# Patient Record
Sex: Female | Born: 1995 | Race: Black or African American | Hispanic: No | Marital: Married | Smoking: Never smoker
Health system: Southern US, Community
[De-identification: ages and names within clinical notes are randomized; demographics above are authoritative.]

## PROBLEM LIST (undated history)

## (undated) DIAGNOSIS — K5090 Crohn's disease, unspecified, without complications: Secondary | ICD-10-CM

## (undated) DIAGNOSIS — F419 Anxiety disorder, unspecified: Secondary | ICD-10-CM

## (undated) HISTORY — DX: Crohn's disease, unspecified, without complications: K50.90

## (undated) HISTORY — PX: COLON SURGERY: SHX602

## (undated) HISTORY — PX: CHOLECYSTECTOMY: SHX55

## (undated) HISTORY — PX: APPENDECTOMY: SHX54

---
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
## 2014-06-27 ENCOUNTER — Emergency Department
Admission: EM | Admit: 2014-06-27 | Discharge: 2014-06-27 | Disposition: A | Discharge status: Home / Self Care | Payer: Managed Care, Other (non HMO) | Attending: Emergency Medicine | Admitting: Emergency Medicine

## 2014-06-27 ENCOUNTER — Encounter: Payer: Self-pay | Admitting: Emergency Medicine

## 2014-06-27 DIAGNOSIS — F419 Anxiety disorder, unspecified: Secondary | ICD-10-CM | POA: Diagnosis not present

## 2014-06-27 DIAGNOSIS — F410 Panic disorder [episodic paroxysmal anxiety] without agoraphobia: Secondary | ICD-10-CM

## 2014-06-27 HISTORY — DX: Anxiety disorder, unspecified: F41.9

## 2014-06-27 HISTORY — DX: Crohn's disease, unspecified, without complications: K50.90

## 2014-06-27 MED ORDER — LORAZEPAM 1 MG PO TABS: 1.0000 mg | ORAL_TABLET | Freq: Once | ORAL | Status: DC

## 2014-06-27 MED ORDER — HYDROXYZINE PAMOATE 25 MG PO CAPS: 25.0000 mg | ORAL_CAPSULE | Freq: Three times a day (TID) | ORAL | Status: DC | PRN

## 2014-06-27 MED ORDER — DIAZEPAM 5 MG PO TABS
5.0000 mg | ORAL_TABLET | Freq: Once | ORAL | Status: AC
  Administered 2014-06-27: 5 mg via ORAL

## 2014-06-27 MED ORDER — DIAZEPAM 5 MG PO TABS
ORAL_TABLET | ORAL | Status: AC
  Administered 2014-06-27: 5 mg via ORAL
  Filled 2014-06-27: qty 1

## 2014-06-27 NOTE — ED Provider Notes (Signed)
[facility] Emergency Department Provider Note  ____________________________________________  Time seen: Approximately 10:45 PM  I have reviewed the triage vital signs and the nursing notes.   HISTORY  Chief Complaint Anxiety    HPI Kimberly Burnett is a 19 y.o. female who presents to the emergency room for complaints of having an anxiety attack earlier tonight. Patient states that she's really been stressed out lately and today she had an attack where she felt like her heart was racing fast in her chest was tight. Patient states she is feeling better at this point. This is her second attack in 2 weeks.   Past Medical History  Diagnosis Date  . Crohn's   . Anxiety   . Crohn disease     There are no active problems to display for this patient.   Past Surgical History  Procedure Laterality Date  . Colon surgery      Current Outpatient Rx  Name  Route  Sig  Dispense  Refill  . acetaminophen (TYLENOL) 325 MG tablet   Oral   Take 650 mg by mouth every 6 (six) hours as needed. For pain          . hydrOXYzine (VISTARIL) 25 MG capsule   Oral   Take 1 capsule (25 mg total) by mouth 3 (three) times daily as needed. For anxiety   30 capsule   0   . PRESCRIPTION MEDICATION   Oral   Take 1 tablet by mouth daily as needed. For pain            Allergies Review of patient's allergies indicates no known allergies.  History reviewed. No pertinent family history.  Social History History  Substance Use Topics  . Smoking status: Never Smoker   . Smokeless tobacco: Not on file  . Alcohol Use: No    Review of Systems Constitutional: No fever/chills Eyes: No visual changes. ENT: No sore throat. Cardiovascular: Denies chest pain. Respiratory: Denies shortness of breath. Gastrointestinal: No abdominal pain.  No nausea, no vomiting.  No diarrhea.  No constipation. Genitourinary: Negative for dysuria. Musculoskeletal: Negative for back  pain. Skin: Negative for rash. Neurological: Negative for headaches, focal weakness or numbness. Psychiatric:Positive for anxiety attacks.  10-point ROS otherwise negative.  ____________________________________________   PHYSICAL EXAM:  VITAL SIGNS: ED Triage Vitals  Enc Vitals Group     BP 06/27/14 2212 131/81 mmHg     Pulse Rate 06/27/14 2212 85     Resp 06/27/14 2212 20     Temp 06/27/14 2212 97.8 F (36.6 C)     Temp Source 06/27/14 2212 Oral     SpO2 06/27/14 2212 97 %     Weight 06/27/14 2212 299 lb (135.626 kg)     Height 06/27/14 2212 6' (1.829 m)     Head Cir --      Peak Flow --      Pain Score 06/27/14 2215 6     Pain Loc --      Pain Edu? --      Excl. in [place]? --     Constitutional: Alert and oriented. Well appearing and in no acute distress. Eyes: Conjunctivae are normal. PERRL. EOMI. Head: Atraumatic. Nose: No congestion/rhinnorhea. Mouth/Throat: Mucous membranes are moist.  Oropharynx non-erythematous. Neck: No stridor.   Cardiovascular: Normal rate, regular rhythm. Grossly normal heart sounds.  Good peripheral circulation. Respiratory: Normal respiratory effort.  No retractions. Lungs CTAB. Gastrointestinal: Soft and nontender. No distention. No abdominal bruits. No CVA  tenderness. Musculoskeletal: No lower extremity tenderness nor edema.  No joint effusions. Neurologic:  Normal speech and language. No gross focal neurologic deficits are appreciated. Speech is normal. No gait instability. Skin:  Skin is warm, dry and intact. No rash noted. Psychiatric: Mood and affect are normal. Speech and behavior are normal.  ____________________________________________   LABS (all labs ordered are listed, but only abnormal results are displayed)  Labs Reviewed - No data to display ____________________________________________  EKG  Not applicable ____________________________________________  RADIOLOGY  Not  applicable ____________________________________________   PROCEDURES  Procedure(s) performed: None  Critical Care performed: No  ____________________________________________   INITIAL IMPRESSION / ASSESSMENT AND PLAN / ED COURSE  Pertinent labs & imaging results that were available during my care of the patient were reviewed by me and considered in my medical decision making (see chart for details).  Acute anxiety attack resolved. Patient's starting no increased stress in her life. Referred to PCP for follow-up in chronic medication if needed. The patient voices no other emergency medical issues at this time. Follow-up if worsening symptomology. ____________________________________________   FINAL CLINICAL IMPRESSION(S) / ED DIAGNOSES  Final diagnoses:  Anxiety attack      Arlyss Repress, PA-C 06/27/14 2344  Ponciano Ort, MD 06/28/14 [phone]

## 2014-06-27 NOTE — Discharge Instructions (Signed)

## 2014-06-27 NOTE — ED Notes (Signed)
Pt arrived to the ED for complaints of having anxiety. Pt states that she has been really stressed lately and today she had an attack where she felt that her heart rate was fast and  Her chest felt tight. Pt is AOx4 in no apparent distress in triage; no anxiety noted.

## 2014-09-28 IMAGING — CR DG ABDOMEN 3V
1 series · 3 of 3 positions shown · non-contrast
Comparison: None.

CLINICAL DATA: Abdominal pain.  History of Crohn's disease.

EXAM:
ABDOMEN SERIES

[Series 1: w chest pa · 0.14mm/px · 3 of 3 slices shown]
[im 1/3]
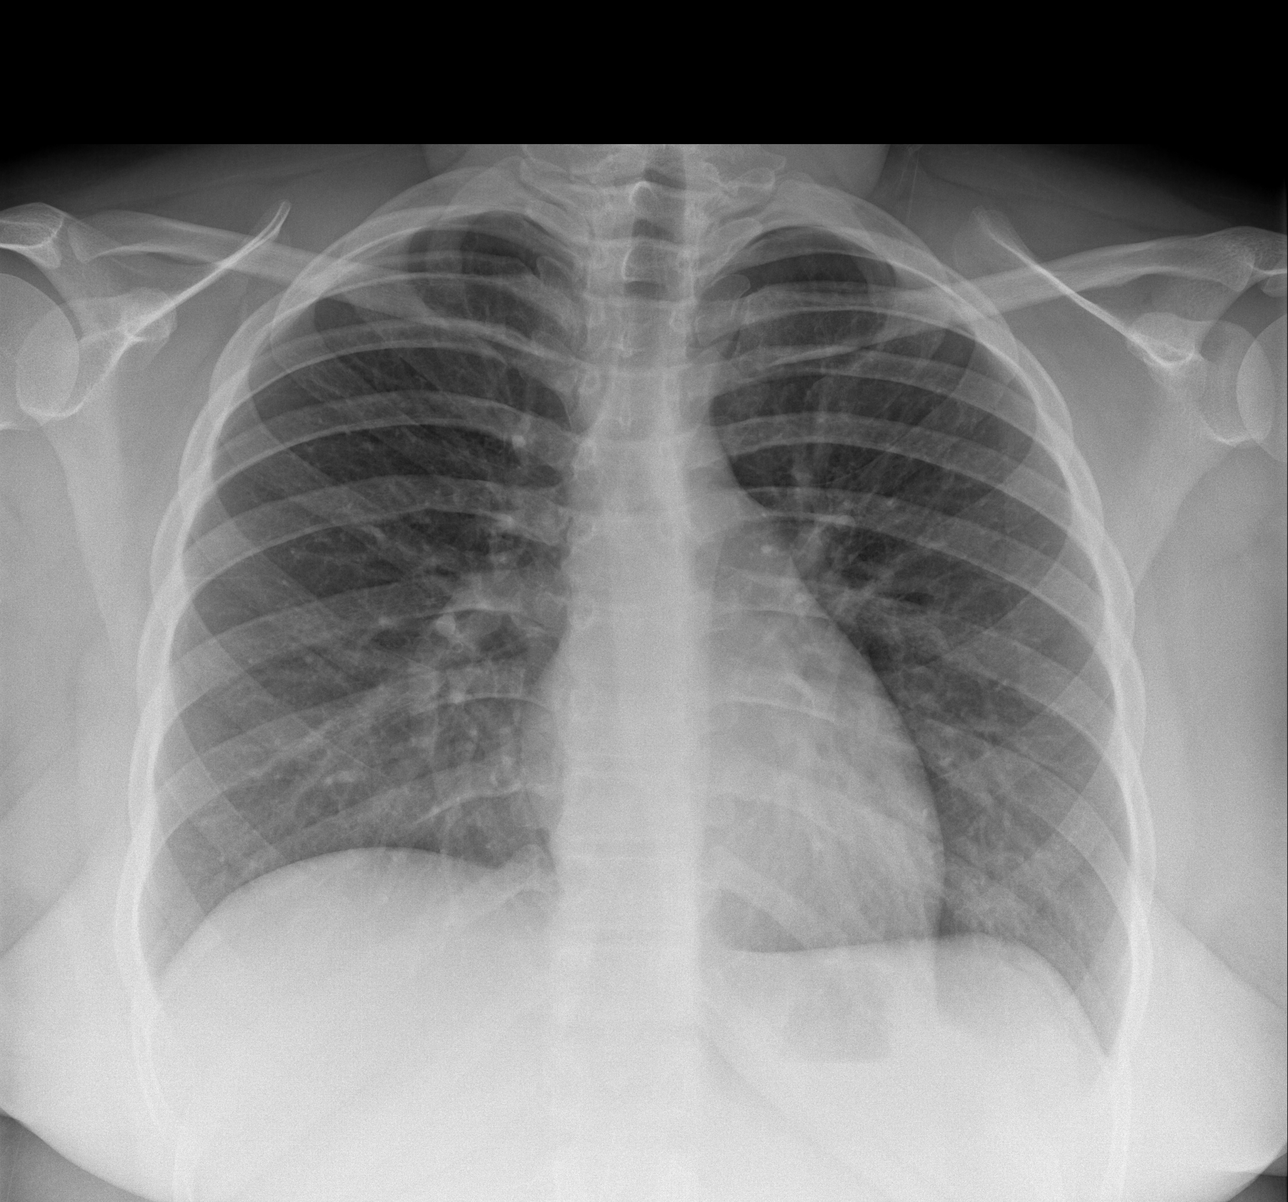
[im 2/3]
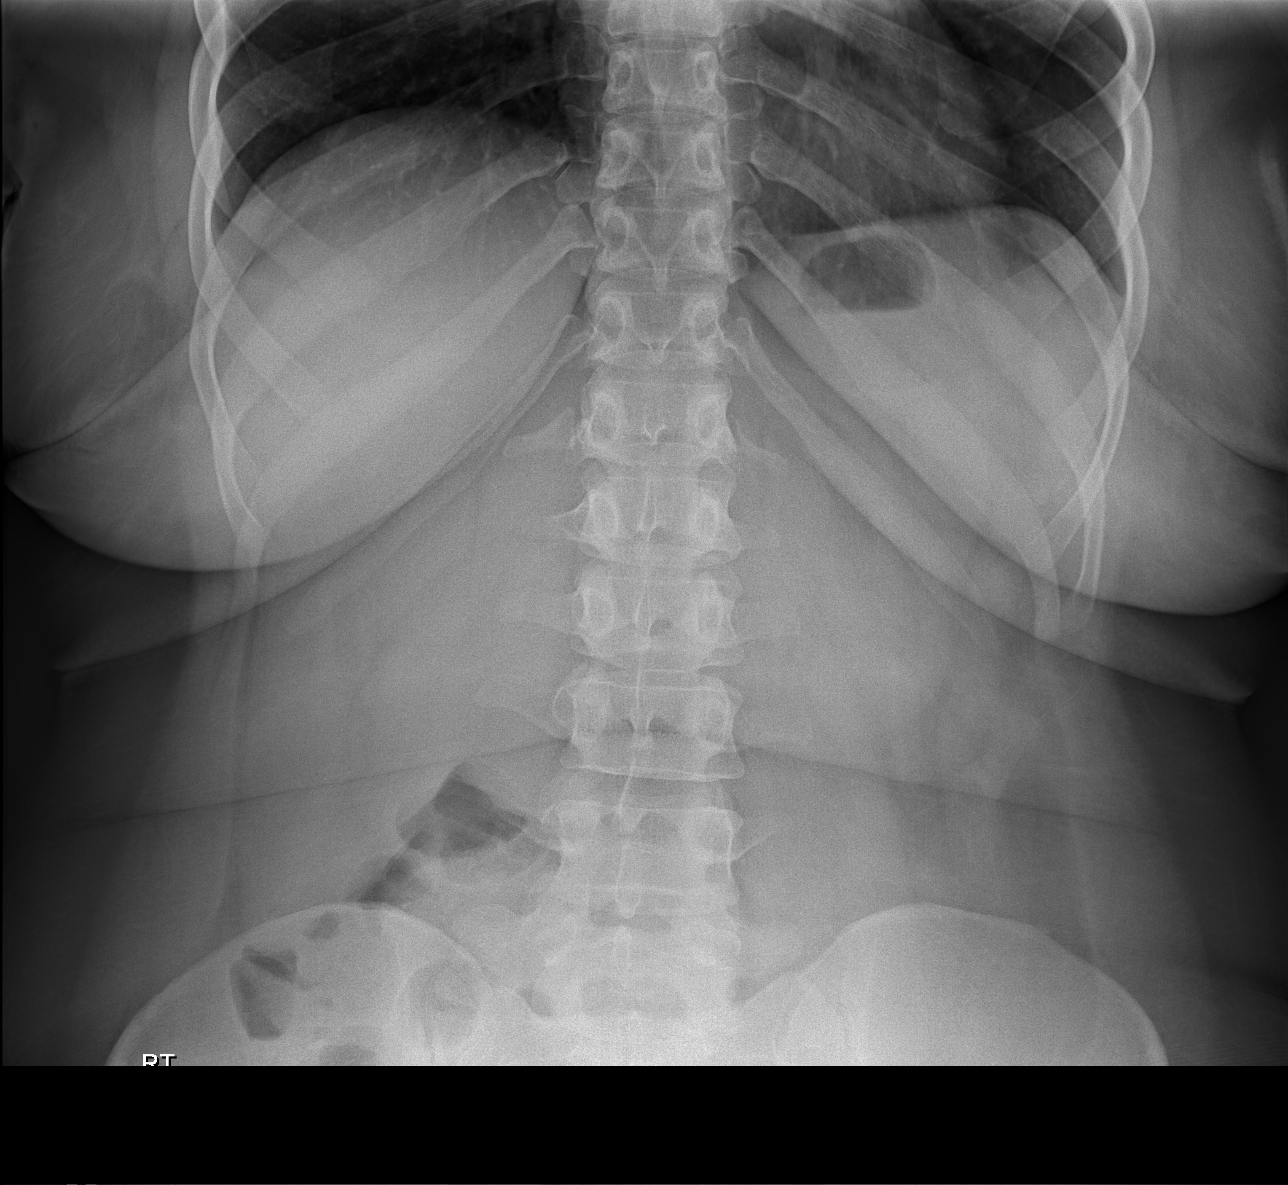
[im 3/3]
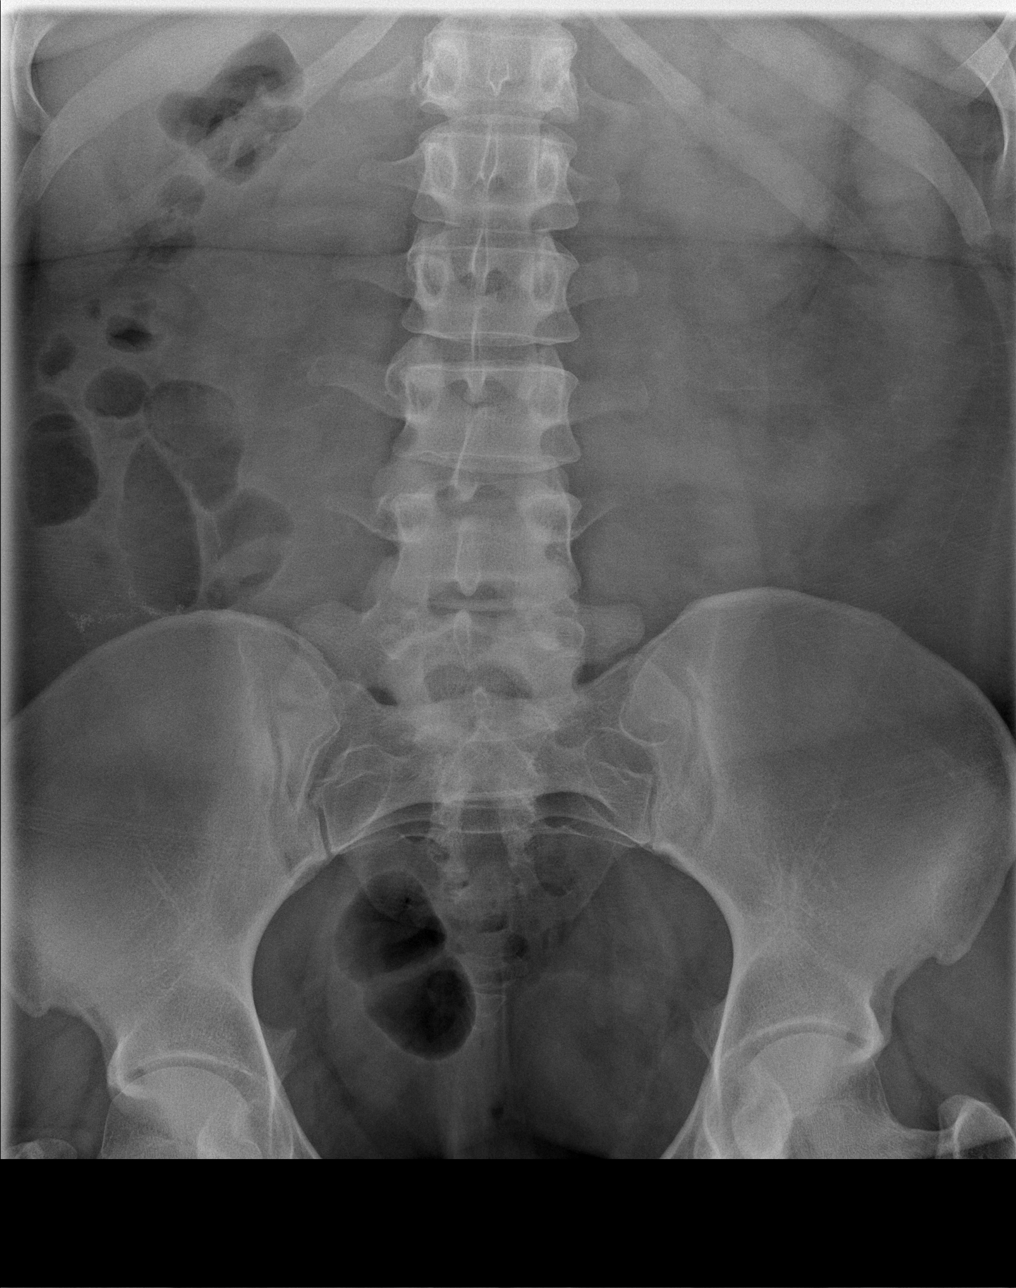

[3 of 3 positions shown; findings below may reference images not displayed]

FINDINGS: There is a bowel anastomosis staple line in the right lower
quadrant. Normal bowel gas pattern. No obstruction. No free air.
Abdominal soft tissues are otherwise unremarkable.

Heart, mediastinum and hila are normal.  Lungs are clear.

No bony abnormality.
IMPRESSION: 1. No acute findings.
2. Status post bowel resection with a bowel anastomosis staple line
in the right upper quadrant.
3. No other abnormality.

## 2014-11-27 ENCOUNTER — Emergency Department
Admission: EM | Admit: 2014-11-27 | Discharge: 2014-11-27 | Disposition: A | Discharge status: Home / Self Care | Payer: Managed Care, Other (non HMO) | Attending: Emergency Medicine | Admitting: Emergency Medicine

## 2014-11-27 ENCOUNTER — Encounter: Payer: Self-pay | Admitting: *Deleted

## 2014-11-27 DIAGNOSIS — H66001 Acute suppurative otitis media without spontaneous rupture of ear drum, right ear: Secondary | ICD-10-CM | POA: Diagnosis not present

## 2014-11-27 DIAGNOSIS — H6091 Unspecified otitis externa, right ear: Secondary | ICD-10-CM | POA: Diagnosis not present

## 2014-11-27 DIAGNOSIS — H9201 Otalgia, right ear: Secondary | ICD-10-CM | POA: Diagnosis present

## 2014-11-27 MED ORDER — AMOXICILLIN 500 MG PO CAPS
500.0000 mg | ORAL_CAPSULE | Freq: Once | ORAL | Status: AC
  Administered 2014-11-27: 500 mg via ORAL
  Filled 2014-11-27: qty 1

## 2014-11-27 MED ORDER — NEOMYCIN-COLIST-HC-THONZONIUM 3.3-3-10-0.5 MG/ML OT SUSP: 3.0000 [drp] | Freq: Once | OTIC | Status: DC

## 2014-11-27 MED ORDER — NEOMYCIN-POLYMYXIN-HC 3.5-10000-1 OT SUSP
3.0000 [drp] | Freq: Once | OTIC | Status: AC
  Administered 2014-11-27: 3 [drp] via OTIC
  Filled 2014-11-27: qty 10

## 2014-11-27 MED ORDER — AMOXICILLIN 500 MG PO CAPS: 500.0000 mg | ORAL_CAPSULE | Freq: Three times a day (TID) | ORAL | Status: DC

## 2014-11-27 NOTE — Discharge Instructions (Signed)
Otitis Externa Otitis externa is a germ infection in the outer ear. The outer ear is the area from the eardrum to the outside of the ear. Otitis externa is sometimes called "swimmer's ear." HOME CARE  Put drops in the ear as told by your doctor.  Only take medicine as told by your doctor.  If you have diabetes, your doctor may give you more directions. Follow your doctor's directions.  Keep all doctor visits as told. To avoid another infection:  Keep your ear dry. Use the corner of a towel to dry your ear after swimming or bathing.  Avoid scratching or putting things inside your ear.  Avoid swimming in lakes, dirty water, or pools that use a chemical called chlorine poorly.  You may use ear drops after swimming. Combine equal amounts of white vinegar and alcohol in a bottle. Put 3 or 4 drops in each ear. GET HELP IF:   You have a fever.  Your ear is still red, puffy (swollen), or painful after 3 days.  You still have yellowish-white fluid (pus) coming from the ear after 3 days.  Your redness, puffiness, or pain gets worse.  You have a really bad headache.  You have redness, puffiness, pain, or tenderness behind your ear. MAKE SURE YOU:   Understand these instructions.  Will watch your condition.  Will get help right away if you are not doing well or get worse.   This information is not intended to replace advice given to you by your health care provider. Make sure you discuss any questions you have with your health care provider.   Document Released: 06/26/2007 Document Revised: 01/28/2014 Document Reviewed: 01/24/2011 Elsevier Interactive Patient Education 2016 [employer].    Tylenol or ibuprofen if needed for ear pain. Use ear drops 4 times a day to the right ear. Take amoxicillin 3 times a day for 10 days. Follow up with your doctor in [place] if any continued problems

## 2014-11-27 NOTE — ED Notes (Signed)
Pt has right earache for 3 days.  No drainage from ear.  No sinus congestion.  Nonsmoker.

## 2014-11-27 NOTE — ED Provider Notes (Signed)
[facility] Emergency Department Provider Note  ____________________________________________  Time seen: Approximately 8:31 PM  I have reviewed the triage vital signs and the nursing notes.   HISTORY  Chief Complaint Otalgia  HPI Kimberly Burnett is a 19 y.o. female is complaining of right earache for 3 days. Patient states she is unaware of any fever. There is been no drainage or upper respiratory symptoms. She states it is extremely painful to touch her right ear. Pain has progressively gotten worse over the last 3 days and now is constant. She rates her pain as an 8 out of 10. She denies any sore throat, cough fever or chills.   Past Medical History  Diagnosis Date  . Crohn's   . Anxiety   . Crohn disease ([place])     There are no active problems to display for this patient.   Past Surgical History  Procedure Laterality Date  . Colon surgery      Current Outpatient Rx  Name  Route  Sig  Dispense  Refill  . acetaminophen (TYLENOL) 325 MG tablet   Oral   Take 650 mg by mouth every 6 (six) hours as needed. For pain          . amoxicillin (AMOXIL) 500 MG capsule   Oral   Take 1 capsule (500 mg total) by mouth 3 (three) times daily.   30 capsule   0   . hydrOXYzine (VISTARIL) 25 MG capsule   Oral   Take 1 capsule (25 mg total) by mouth 3 (three) times daily as needed. For anxiety   30 capsule   0   . PRESCRIPTION MEDICATION   Oral   Take 1 tablet by mouth daily as needed. For pain            Allergies Review of patient's allergies indicates no known allergies.  No family history on file.  Social History Social History  Substance Use Topics  . Smoking status: Never Smoker   . Smokeless tobacco: None  . Alcohol Use: No    Review of Systems Constitutional: No fever/chills Eyes: No visual changes. ENT: No sore throat. Positive right ear pain Cardiovascular: Denies chest pain. Respiratory: Denies shortness of  breath. Gastrointestinal:   No nausea, no vomiting. Musculoskeletal: Negative for back pain. Skin: Negative for rash. Neurological: Negative for headaches, focal weakness or numbness.  10-point ROS otherwise negative.  ____________________________________________   PHYSICAL EXAM:  VITAL SIGNS: ED Triage Vitals  Enc Vitals Group     BP 11/27/14 1940 141/63 mmHg     Pulse Rate 11/27/14 1940 86     Resp 11/27/14 1940 20     Temp 11/27/14 1940 99.6 F (37.6 C)     Temp Source 11/27/14 1940 Oral     SpO2 11/27/14 1940 96 %     Weight 11/27/14 1940 300 lb (136.079 kg)     Height 11/27/14 1940 6' (1.829 m)     Head Cir --      Peak Flow --      Pain Score 11/27/14 1942 8     Pain Loc --      Pain Edu? --      Excl. in [place]? --     Constitutional: Alert and oriented. Well appearing and in no acute distress. Eyes: Conjunctivae are normal. PERRL. EOMI. Head: Atraumatic. Nose: No congestion/rhinnorhea.    Left EAC is clear TM is slightly dull. Right EAC is extremely tender during exam with mild exudate  present. TMs slightly red with poor light reflex. Mouth/Throat: Mucous membranes are moist.  Oropharynx non-erythematous. Neck: No stridor.   Hematological/Lymphatic/Immunilogical: No cervical lymphadenopathy. Cardiovascular: Normal rate, regular rhythm. Grossly normal heart sounds.  Good peripheral circulation. Respiratory: Normal respiratory effort.  No retractions. Lungs CTAB. Gastrointestinal: Soft and nontender. No distention. No abdominal bruits. No CVA tenderness. Musculoskeletal: Moves upper and lower extremities without any difficulty. Neurologic:  Normal speech and language. No gross focal neurologic deficits are appreciated. No gait instability. Skin:  Skin is warm, dry and intact. No rash noted. Psychiatric: Mood and affect are normal. Speech and behavior are normal.  ____________________________________________   LABS (all labs ordered are listed, but only abnormal  results are displayed)  Labs Reviewed - No data to display _   PROCEDURES  Procedure(s) performed: None  Critical Care performed: No  ____________________________________________   INITIAL IMPRESSION / ASSESSMENT AND PLAN / ED COURSE  Pertinent labs & imaging results that were available during my care of the patient were reviewed by me and considered in my medical decision making (see chart for details).  Patient was placed on amoxicillin 500 mg 3 times a day for 10 days along with Cortisporin otic suspension to be placed in the right ear. She is to follow-up with her doctor in [place] if any continued problems. ____________________________________________   FINAL CLINICAL IMPRESSION(S) / ED DIAGNOSES  Final diagnoses:  Acute suppurative otitis media of right ear without spontaneous rupture of tympanic membrane, recurrence not specified  Otitis externa; acute, right      Johnn Hai, PA-C 11/27/14 2114  Nena Polio, MD 11/27/14 2357

## 2015-03-28 ENCOUNTER — Emergency Department
Admission: EM | Admit: 2015-03-28 | Discharge: 2015-03-28 | Disposition: A | Discharge status: Home / Self Care | Payer: Managed Care, Other (non HMO) | Attending: Emergency Medicine | Admitting: Emergency Medicine

## 2015-03-28 ENCOUNTER — Encounter: Payer: Self-pay | Admitting: *Deleted

## 2015-03-28 DIAGNOSIS — N939 Abnormal uterine and vaginal bleeding, unspecified: Secondary | ICD-10-CM | POA: Insufficient documentation

## 2015-03-28 DIAGNOSIS — Z3202 Encounter for pregnancy test, result negative: Secondary | ICD-10-CM | POA: Insufficient documentation

## 2015-03-28 DIAGNOSIS — Z792 Long term (current) use of antibiotics: Secondary | ICD-10-CM | POA: Insufficient documentation

## 2015-03-28 LAB — CBC WITH DIFFERENTIAL/PLATELET
BASOS PCT: 0 %
Basophils Absolute: 0 10*3/uL (ref 0–0.1)
EOS ABS: 0.2 10*3/uL (ref 0–0.7)
Eosinophils Relative: 2 %
HEMATOCRIT: 39.4 % (ref 35.0–47.0)
Hemoglobin: 13.3 g/dL (ref 12.0–16.0)
LYMPHS ABS: 2.8 10*3/uL (ref 1.0–3.6)
Lymphocytes Relative: 26 %
MCH: 29.8 pg (ref 26.0–34.0)
MCHC: 33.7 g/dL (ref 32.0–36.0)
MCV: 88.4 fL (ref 80.0–100.0)
MONO ABS: 0.7 10*3/uL (ref 0.2–0.9)
MONOS PCT: 7 %
NEUTROS ABS: 7 10*3/uL — AB (ref 1.4–6.5)
Neutrophils Relative %: 65 %
Platelets: 329 10*3/uL (ref 150–440)
RBC: 4.45 MIL/uL (ref 3.80–5.20)
RDW: 13.2 % (ref 11.5–14.5)
WBC: 10.7 10*3/uL (ref 3.6–11.0)

## 2015-03-28 LAB — BASIC METABOLIC PANEL
Anion gap: 6 (ref 5–15)
BUN: 11 mg/dL (ref 6–20)
CALCIUM: 8.6 mg/dL — AB (ref 8.9–10.3)
CO2: 24 mmol/L (ref 22–32)
CREATININE: 0.65 mg/dL (ref 0.44–1.00)
Chloride: 110 mmol/L (ref 101–111)
GFR calc Af Amer: >60 mL/min (ref >60)
GFR calc non Af Amer: >60 mL/min (ref >60)
GLUCOSE: 85 mg/dL (ref 65–99)
Potassium: 3.8 mmol/L (ref 3.5–5.1)
Sodium: 140 mmol/L (ref 135–145)

## 2015-03-28 LAB — URINALYSIS COMPLETE WITH MICROSCOPIC (ARMC ONLY)
Bilirubin Urine: NEGATIVE
GLUCOSE, UA: NEGATIVE mg/dL
Ketones, ur: NEGATIVE mg/dL
NITRITE: NEGATIVE
PROTEIN: NEGATIVE mg/dL
SPECIFIC GRAVITY, URINE: 1.026 (ref 1.005–1.030)
pH: 6 (ref 5.0–8.0)

## 2015-03-28 LAB — POCT PREGNANCY, URINE: PREG TEST UR: NEGATIVE

## 2015-03-28 LAB — HCG, QUANTITATIVE, PREGNANCY: HCG, BETA CHAIN, QUANT, S: 1 m[IU]/mL (ref <5)

## 2015-03-28 NOTE — Discharge Instructions (Signed)

## 2015-03-28 NOTE — ED Notes (Signed)
Pt reports vaginal bleeding that began today.  Last menses was 2 weeks ago.  No dysuria.   Pt has low abd discomfort.

## 2015-03-28 NOTE — ED Notes (Signed)
Pt unable to void at this time. 

## 2015-03-28 NOTE — ED Notes (Signed)
Pt here for vaginal bleeding states abnormal time for her regular menses

## 2015-03-28 NOTE — ED Provider Notes (Signed)
[facility] Emergency Department Provider Note  ____________________________________________   I have reviewed the triage vital signs and the nursing notes.   HISTORY  Chief Complaint Vaginal Bleeding    HPI Kimberly Burnett is a 20 y.o. female who suffers from unfortunately morbid obesity, Crohn's disease anxiety, who states that she has not had any recent flares in her Crohn's disease and has been very well managed for several years off medication. She has no history ofabnormal vaginal bleeding. She states that she is not on birth control. Last menstrual period was late February. Thyroid 22nd. Patient states it was normal. She is here because she has had some breakthrough vaginal bleeding. She has had some mild spotting and then vaginal bleeding today. Less than a period. No abdominal pain. Not pregnant. Denies fever or chills. No vaginal discharge, no dysuria no urinary frequency, no rectal bleeding, no diarrhea no Crohn symptoms. She did have her yearly exam this year.  Past Medical History  Diagnosis Date  . Crohn's   . Anxiety   . Crohn disease ([place])     There are no active problems to display for this patient.   Past Surgical History  Procedure Laterality Date  . Colon surgery      Current Outpatient Rx  Name  Route  Sig  Dispense  Refill  . acetaminophen (TYLENOL) 325 MG tablet   Oral   Take 650 mg by mouth every 6 (six) hours as needed. For pain          . amoxicillin (AMOXIL) 500 MG capsule   Oral   Take 1 capsule (500 mg total) by mouth 3 (three) times daily.   30 capsule   0   . hydrOXYzine (VISTARIL) 25 MG capsule   Oral   Take 1 capsule (25 mg total) by mouth 3 (three) times daily as needed. For anxiety   30 capsule   0   . PRESCRIPTION MEDICATION   Oral   Take 1 tablet by mouth daily as needed. For pain            Allergies Review of patient's allergies indicates no known allergies.  No family history on  file.  Social History Social History  Substance Use Topics  . Smoking status: Never Smoker   . Smokeless tobacco: None  . Alcohol Use: No    Review of Systems Constitutional: No fever/chills Eyes: No visual changes. ENT: No sore throat. No stiff neck no neck pain Cardiovascular: Denies chest pain. Respiratory: Denies shortness of breath. Gastrointestinal:   no vomiting.  No diarrhea.  No constipation. Genitourinary: Negative for dysuria. Musculoskeletal: Negative lower extremity swelling Skin: Negative for rash. Neurological: Negative for headaches, focal weakness or numbness. 10-point ROS otherwise negative.  ____________________________________________   PHYSICAL EXAM:  VITAL SIGNS: ED Triage Vitals  Enc Vitals Group     BP 03/28/15 1949 133/101 mmHg     Pulse Rate 03/28/15 1949 95     Resp 03/28/15 1949 18     Temp 03/28/15 1949 98.4 F (36.9 C)     Temp Source 03/28/15 1949 Oral     SpO2 03/28/15 1949 98 %     Weight 03/28/15 1949 300 lb (136.079 kg)     Height 03/28/15 1949 6' (1.829 m)     Head Cir --      Peak Flow --      Pain Score 03/28/15 1953 3     Pain Loc --  Pain Edu? --      Excl. in [place]? --     Constitutional: Alert and oriented. Well appearing and in no acute distress. Eyes: Conjunctivae are normal. PERRL. EOMI. Head: Atraumatic. Nose: No congestion/rhinnorhea. Mouth/Throat: Mucous membranes are moist.  Oropharynx non-erythematous. Neck: No stridor.   Nontender with no meningismus Cardiovascular: Normal rate, regular rhythm. Grossly normal heart sounds.  Good peripheral circulation. Respiratory: Normal respiratory effort.  No retractions. Lungs CTAB. Abdominal: Soft and nontender. No distention. No guarding no rebound Back:  There is no focal tenderness or step off there is no midline tenderness there are no lesions noted. there is no CVA tenderness Pelvic exam: Female nurse chaperone present, no external lesions noted, physiologic  vaginal discharge noted with no purulent discharge, no cervical motion tenderness, no adnexal tenderness or mass, there is no significant uterine tenderness or mass. There is trace dark blood noted. Musculoskeletal: No lower extremity tenderness. No joint effusions, no DVT signs strong distal pulses no edema Neurologic:  Normal speech and language. No gross focal neurologic deficits are appreciated.  Skin:  Skin is warm, dry and intact. No rash noted. Psychiatric: Mood and affect are normal. Speech and behavior are normal.  ____________________________________________   LABS (all labs ordered are listed, but only abnormal results are displayed)  Labs Reviewed  BASIC METABOLIC PANEL - Abnormal; Notable for the following:    Calcium 8.6 (*)    All other components within normal limits  CBC WITH DIFFERENTIAL/PLATELET - Abnormal; Notable for the following:    Neutro Abs 7.0 (*)    All other components within normal limits  URINALYSIS COMPLETEWITH MICROSCOPIC ([facility] ONLY) - Abnormal; Notable for the following:    Color, Urine YELLOW (*)    APPearance CLOUDY (*)    Hgb urine dipstick 3+ (*)    Leukocytes, UA TRACE (*)    Bacteria, UA MANY (*)    Squamous Epithelial / LPF 6-30 (*)    All other components within normal limits  URINE CULTURE  HCG, QUANTITATIVE, PREGNANCY  POC URINE PREG, ED  POCT PREGNANCY, URINE   ____________________________________________  EKG  I personally interpreted any EKGs ordered by me or triage  ____________________________________________  RADIOLOGY  I reviewed any imaging ordered by me or triage that were performed during my shift and, if possible, patient and/or family made aware of any abnormal findings. ____________________________________________   PROCEDURES  Procedure(s) performed: None  Critical Care performed: None  ____________________________________________   INITIAL IMPRESSION / ASSESSMENT AND PLAN / ED COURSE  Pertinent labs &  imaging results that were available during my care of the patient were reviewed by me and considered in my medical decision making (see chart for details).  Patient with vaginal bleeding, had spotting for a few days. Negative pregnancy test normal pelvic exam aside from scant bleeding, which appears old. I did do a rectal exam which came back trace guaiac positive but given her morbid obesity and recent bleeding, it was very difficult to clean the area. I do not think this is likely from the rectum and patient is sure that it is not. Her UA is suggestive of possible UTI however it is not a very clean catch with 30 epithelial cells, patient would prefer not to have a urine catheter we'll send a urine culture. I do not think that empiric treatment for UTIs and decayed at this point I do not think this represents hematuria although there is some blood in her urine again patient has been having vaginal  bleeding. Patient does have an OB/GYN she will follow up with for this atypical bleeding, she also has a Crohn's disease doctor her primary care doctor have stressed outpatient follow-up. Hemodynamically stable and hemoglobin is reassuring. Return precautions and follow-up given and understood. ____________________________________________   FINAL CLINICAL IMPRESSION(S) / ED DIAGNOSES  Final diagnoses:  None      This chart was dictated using voice recognition software.  Despite best efforts to proofread,  errors can occur which can change meaning.     Schuyler Amor, MD 03/28/15 2151

## 2015-03-30 LAB — URINE CULTURE

## 2015-08-11 ENCOUNTER — Ambulatory Visit
Admission: EM | Admit: 2015-08-11 | Discharge: 2015-08-11 | Disposition: A | Discharge status: Home / Self Care | Payer: Managed Care, Other (non HMO) | Attending: Family Medicine | Admitting: Family Medicine

## 2015-08-11 ENCOUNTER — Encounter: Payer: Self-pay | Admitting: *Deleted

## 2015-08-11 DIAGNOSIS — N390 Urinary tract infection, site not specified: Secondary | ICD-10-CM

## 2015-08-11 LAB — URINALYSIS COMPLETE WITH MICROSCOPIC (ARMC ONLY)
Glucose, UA: NEGATIVE mg/dL
LEUKOCYTES UA: NEGATIVE
Nitrite: NEGATIVE
PH: 6 (ref 5.0–8.0)
PROTEIN: 30 mg/dL — AB

## 2015-08-11 LAB — PREGNANCY, URINE: PREG TEST UR: NEGATIVE

## 2015-08-11 MED ORDER — SULFAMETHOXAZOLE-TRIMETHOPRIM 800-160 MG PO TABS: 1.0000 | ORAL_TABLET | Freq: Two times a day (BID) | ORAL | Status: AC

## 2015-08-11 NOTE — ED Provider Notes (Signed)
[facility]  ____________________________________________  Time seen: Approximately 8:49 PM  I have reviewed the triage vital signs and the nursing notes.   HISTORY  Chief Complaint Abdominal Pain   HPI Kimberly Burnett is a 20 y.o. female presents with a complaint of 2 days of suprapubic pressure. Denies any other complaints. Reports continues to eat and drink well. Denies fall or trauma. Denies any anything that can make symptoms worse or better. Denies history of similar. Denies any aggravating factors. Denies urinary frequency, urinary urgency, burning with urination, vaginal discharge, vaginal odor, concern for STDs, blood in stool, constipation, back pain, fevers, nausea, vomiting, diarrhea, chest pain, shortness of breath, extremity pain or extremity swelling.  Patient's last menstrual period was 07/28/2015 (exact date). Denies concern for pregnancy.  PCP: [facility]   Past Medical History  Diagnosis Date  . Crohn's   . Anxiety   . Crohn disease ([place])     There are no active problems to display for this patient.   Past Surgical History  Procedure Laterality Date  . Colon surgery      Current Outpatient Rx  Name  Route  Sig  Dispense  Refill  .           . escitalopram (LEXAPRO) 10 MG tablet   Oral   Take 10 mg by mouth daily.         .           .           .             Allergies Review of patient's allergies indicates no known allergies.  History reviewed. No pertinent family history.  Social History Social History  Substance Use Topics  . Smoking status: Never Smoker   . Smokeless tobacco: None  . Alcohol Use: Yes    Review of Systems Constitutional: No fever/chills Eyes: No visual changes. ENT: No sore throat. Cardiovascular: Denies chest pain. Respiratory: Denies shortness of breath. Gastrointestinal: As above.  No nausea, no vomiting.  No diarrhea.  No constipation. Genitourinary: Negative for dysuria. Musculoskeletal:  Negative for back pain. Skin: Negative for rash. Neurological: Negative for headaches, focal weakness or numbness.  10-point ROS otherwise negative.  ____________________________________________   PHYSICAL EXAM:  VITAL SIGNS: ED Triage Vitals  Enc Vitals Group     BP 08/11/15 2003 148/93 mmHg     Pulse Rate 08/11/15 2003 87     Resp 08/11/15 2003 16     Temp 08/11/15 2003 97.6 F (36.4 C)     Temp Source 08/11/15 2003 Oral     SpO2 08/11/15 2003 99 %     Weight 08/11/15 2003 350 lb (158.759 kg)     Height 08/11/15 2003 6' (1.829 m)     Head Cir --      Peak Flow --      Pain Score 08/11/15 2008 8     Pain Loc --      Pain Edu? --      Excl. in [place]? --     Constitutional: Alert and oriented. Well appearing and in no acute distress. Eyes: Conjunctivae are normal. PERRL. EOMI. Head: Atraumatic.  Ears: Normal external appearance bilaterally.  Nose: No congestion/rhinnorhea.  Mouth/Throat: Mucous membranes are moist.  Oropharynx non-erythematous. Cardiovascular: Normal rate, regular rhythm. Grossly normal heart sounds.  Good peripheral circulation. Respiratory: Normal respiratory effort.  No retractions. Lungs CTAB. No wheezes, rales or rhonchi. Gastrointestinal: Morbidly obese abdomen. Mild midline  suprapubic tenderness to palpation, abdomen otherwise soft and nontender. Normal Bowel sounds. No CVA tenderness. Musculoskeletal: Ambulatory with steady gait. Neurologic:  Normal speech and language. No gross focal neurologic deficits are appreciated. No gait instability. Skin:  Skin is warm, dry and intact. No rash noted. Psychiatric: Mood and affect are normal. Speech and behavior are normal.  ____________________________________________   LABS (all labs ordered are listed, but only abnormal results are displayed)  Labs Reviewed  URINALYSIS COMPLETEWITH MICROSCOPIC ([place]) - Abnormal; Notable for the following:    APPearance HAZY (*)    Bilirubin Urine SMALL (*)     Ketones, ur TRACE (*)    Specific Gravity, Urine >1.030 (*)    Hgb urine dipstick MODERATE (*)    Protein, ur 30 (*)    Bacteria, UA MANY (*)    Squamous Epithelial / LPF 6-30 (*)    All other components within normal limits  URINE CULTURE  PREGNANCY, URINE     INITIAL IMPRESSION / ASSESSMENT AND PLAN / ED COURSE  Pertinent labs & imaging results that were available during my care of the patient were reviewed by me and considered in my medical decision making (see chart for details).  Very well-appearing patient. No acute distress. Presents for complaints of suprapubic pressure. Mild suprapubic tenderness on exam, abdomen otherwise nontender. Urinalysis positive for many bacteria, hazy appearance, small bilirubin, trace ketones and protein. Suspect urinary tract infection will culture urine. Will treat with oral Bactrim 7 days. Encourage PCP follow up in one week. Encouraged to void post sexual intercourse.Discussed indication, risks and benefits of medications with patient.  Discussed follow up with Primary care physician this week. Discussed follow up and return parameters including no resolution or any worsening concerns. Patient verbalized understanding and agreed to plan.   ____________________________________________   FINAL CLINICAL IMPRESSION(S) / ED DIAGNOSES  Final diagnoses:  UTI (lower urinary tract infection)     New Prescriptions   SULFAMETHOXAZOLE-TRIMETHOPRIM (BACTRIM DS,SEPTRA DS) 800-160 MG TABLET    Take 1 tablet by mouth 2 (two) times daily. For 7 days    Note: This dictation was prepared with Dragon dictation along with smaller phrase technology. Any transcriptional errors that result from this process are unintentional.       Marylene Land, NP 08/11/15 2055

## 2015-08-11 NOTE — ED Notes (Signed)
Lower quadrant abd pain described as pressure or "pushing" sensation since yesterday, worse today. Denies other symptoms.

## 2015-08-11 NOTE — Discharge Instructions (Signed)
Take medication as prescribed. Rest. Drink plenty of fluids.   Follow up with your primary care physician this week . Return to Urgent care for new or worsening concerns. \  Urinary Tract Infection Urinary tract infections (UTIs) can develop anywhere along your urinary tract. Your urinary tract is your body's drainage system for removing wastes and extra water. Your urinary tract includes two kidneys, two ureters, a bladder, and a urethra. Your kidneys are a pair of bean-shaped organs. Each kidney is about the size of your fist. They are located below your ribs, one on each side of your spine. CAUSES Infections are caused by microbes, which are microscopic organisms, including fungi, viruses, and bacteria. These organisms are so small that they can only be seen through a microscope. Bacteria are the microbes that most commonly cause UTIs. SYMPTOMS  Symptoms of UTIs may vary by age and gender of the patient and by the location of the infection. Symptoms in young women typically include a frequent and intense urge to urinate and a painful, burning feeling in the bladder or urethra during urination. Older women and men are more likely to be tired, shaky, and weak and have muscle aches and abdominal pain. A fever may mean the infection is in your kidneys. Other symptoms of a kidney infection include pain in your back or sides below the ribs, nausea, and vomiting. DIAGNOSIS To diagnose a UTI, your caregiver will ask you about your symptoms. Your caregiver will also ask you to provide a urine sample. The urine sample will be tested for bacteria and white blood cells. White blood cells are made by your body to help fight infection. TREATMENT  Typically, UTIs can be treated with medication. Because most UTIs are caused by a bacterial infection, they usually can be treated with the use of antibiotics. The choice of antibiotic and length of treatment depend on your symptoms and the type of bacteria causing your  infection. HOME CARE INSTRUCTIONS  If you were prescribed antibiotics, take them exactly as your caregiver instructs you. Finish the medication even if you feel better after you have only taken some of the medication.  Drink enough water and fluids to keep your urine clear or pale yellow.  Avoid caffeine, tea, and carbonated beverages. They tend to irritate your bladder.  Empty your bladder often. Avoid holding urine for long periods of time.  Empty your bladder before and after sexual intercourse.  After a bowel movement, women should cleanse from front to back. Use each tissue only once. SEEK MEDICAL CARE IF:   You have back pain.  You develop a fever.  Your symptoms do not begin to resolve within 3 days. SEEK IMMEDIATE MEDICAL CARE IF:   You have severe back pain or lower abdominal pain.  You develop chills.  You have nausea or vomiting.  You have continued burning or discomfort with urination. MAKE SURE YOU:   Understand these instructions.  Will watch your condition.  Will get help right away if you are not doing well or get worse.   This information is not intended to replace advice given to you by your health care provider. Make sure you discuss any questions you have with your health care provider.   Document Released: 10/17/2004 Document Revised: 09/28/2014 Document Reviewed: 02/15/2011 Elsevier Interactive Patient Education [employer].

## 2015-08-13 LAB — URINE CULTURE: SPECIAL REQUESTS: NORMAL

## 2017-11-04 ENCOUNTER — Ambulatory Visit: Payer: Self-pay | Admitting: Family Medicine

## 2017-11-06 ENCOUNTER — Encounter: Payer: Self-pay | Admitting: Family Medicine

## 2017-11-06 ENCOUNTER — Ambulatory Visit (INDEPENDENT_AMBULATORY_CARE_PROVIDER_SITE_OTHER): Admitting: Family Medicine

## 2017-11-06 ENCOUNTER — Other Ambulatory Visit: Payer: Self-pay

## 2017-11-06 VITALS — BP 126/83 | HR 88 | Temp 98.2°F | Ht 71.5 in | Wt 338.0 lb

## 2017-11-06 DIAGNOSIS — F53 Postpartum depression: Secondary | ICD-10-CM

## 2017-11-06 DIAGNOSIS — Z7689 Persons encountering health services in other specified circumstances: Secondary | ICD-10-CM | POA: Diagnosis not present

## 2017-11-06 DIAGNOSIS — O99345 Other mental disorders complicating the puerperium: Secondary | ICD-10-CM

## 2017-11-06 DIAGNOSIS — K50919 Crohn's disease, unspecified, with unspecified complications: Secondary | ICD-10-CM

## 2017-11-06 NOTE — Progress Notes (Signed)
   BP 126/83   Pulse 88   Temp 98.2 F (36.8 C) (Oral)   Ht 5' 11.5" (1.816 m)   Wt (!) 338 lb (153.3 kg)   LMP 11/03/2017   SpO2 96%   BMI 46.48 kg/m    Subjective:    Patient ID: Kimberly Burnett, female    DOB: 11-29-1995, 22 y.o.   MRN: [id number]  HPI: Kimberly Burnett is a 22 y.o. female  Chief Complaint  Patient presents with  . New Patient (Initial Visit)    pt states she would like a referral for a GI and med refill, Humira   Here today to establish care, recently moved here from [place].   Had her daughter in Jan 2019 and had PPD, takes zoloft once daily. Does note some fatigue with it but thinks it helps overall. Denies SI/HI. Not interested in counseling at this time.   Last CPE unknown.   Used to go to [facility] when she lived here previously for Crohn's disease. Needs referral back there as she had to establish with someone else in [place] while living there. Currently well managed on humira. Will run out in the next 3 weeks.   No new concerns today, states things are going very well.    Relevant past medical, surgical, family and social history reviewed and updated as indicated. Interim medical history since our last visit reviewed. Allergies and medications reviewed and updated.  Review of Systems  Per HPI unless specifically indicated above     Objective:    BP 126/83   Pulse 88   Temp 98.2 F (36.8 C) (Oral)   Ht 5' 11.5" (1.816 m)   Wt (!) 338 lb (153.3 kg)   LMP 11/03/2017   SpO2 96%   BMI 46.48 kg/m   Wt Readings from Last 3 Encounters:  11/06/17 (!) 338 lb (153.3 kg)    Physical Exam  Constitutional: She is oriented to person, place, and time. She appears well-developed and well-nourished. No distress.  HENT:  Head: Atraumatic.  Eyes: Conjunctivae are normal.  Neck: Normal range of motion. Neck supple.  Cardiovascular: Normal rate, regular rhythm and normal heart sounds.  Pulmonary/Chest: Effort normal and breath sounds normal.  Musculoskeletal:  Normal range of motion.  Neurological: She is alert and oriented to person, place, and time.  Skin: Skin is warm and dry.  Psychiatric: She has a normal mood and affect. Her behavior is normal.  Nursing note and vitals reviewed.  No results found for this or any previous visit.    Assessment & Plan:   Problem List Items Addressed This Visit      Digestive   Crohn's disease ([place])    Stable on humira, referral placed back to [facility]. Will bridge her humira if needed depending on how quickly she gets established      Relevant Orders   Ambulatory referral to Gastroenterology     Other   Post partum depression - Primary    Stable on zoloft, continue current regimen. Recommended counseling, pt declined.       Relevant Medications   sertraline (ZOLOFT) 50 MG tablet    Other Visit Diagnoses    Encounter to establish care           Follow up plan: Return for CPE.

## 2017-11-07 DIAGNOSIS — K5090 Crohn's disease, unspecified, without complications: Secondary | ICD-10-CM | POA: Insufficient documentation

## 2017-11-07 DIAGNOSIS — F339 Major depressive disorder, recurrent, unspecified: Secondary | ICD-10-CM | POA: Insufficient documentation

## 2017-11-07 NOTE — Assessment & Plan Note (Signed)
Stable on zoloft, continue current regimen. Recommended counseling, pt declined.

## 2017-11-07 NOTE — Assessment & Plan Note (Signed)
Stable on humira, referral placed back to [facility]. Will bridge her humira if needed depending on how quickly she gets established

## 2017-11-07 NOTE — Patient Instructions (Signed)
Follow up for CPE

## 2017-11-10 ENCOUNTER — Encounter: Payer: Self-pay | Admitting: *Deleted

## 2018-02-05 ENCOUNTER — Ambulatory Visit: Admitting: Family Medicine

## 2018-02-06 ENCOUNTER — Encounter: Payer: Self-pay | Admitting: Family Medicine

## 2018-02-06 ENCOUNTER — Ambulatory Visit (INDEPENDENT_AMBULATORY_CARE_PROVIDER_SITE_OTHER): Admitting: Family Medicine

## 2018-02-06 VITALS — BP 129/82 | HR 81 | Temp 97.8°F | Wt 345.2 lb

## 2018-02-06 DIAGNOSIS — F331 Major depressive disorder, recurrent, moderate: Secondary | ICD-10-CM

## 2018-02-06 DIAGNOSIS — Z6841 Body Mass Index (BMI) 40.0 and over, adult: Secondary | ICD-10-CM | POA: Diagnosis not present

## 2018-02-06 DIAGNOSIS — E669 Obesity, unspecified: Secondary | ICD-10-CM | POA: Insufficient documentation

## 2018-02-06 MED ORDER — BUPROPION HCL ER (XL) 150 MG PO TB24: 150.0000 mg | ORAL_TABLET | Freq: Every day | ORAL | 0 refills | Status: DC

## 2018-02-06 NOTE — Progress Notes (Signed)
BP 129/82   Pulse 81   Temp 97.8 F (36.6 C) (Oral)   Wt (!) 345 lb 3.2 oz (156.6 kg)   LMP 01/23/2018 (Exact Date)   SpO2 97%   BMI 47.47 kg/m    Subjective:    Patient ID: Kimberly Burnett, female    DOB: [birth year], 23 y.o.   MRN: [id number]  HPI: Kimberly Burnett is a 23 y.o. female  Chief Complaint  Patient presents with  . Depression   Here today for 1 month depression f/u. Has been taking zoloft for months and thinks it does fairly well but still making her sleepy. Does feel like her moods have improved since starting. Denies SI/HI.  Very concerned about her weight. Started weight watchers recently which has been going ok so far. Wanting to meet with a dietician for further advice.   Depression screen PHQ 2/9 02/06/2018  Decreased Interest 1  Down, Depressed, Hopeless 1  PHQ - 2 Score 2  Altered sleeping 1  Tired, decreased energy 1  Change in appetite 2  Feeling bad or failure about yourself  0  Trouble concentrating 0  Moving slowly or fidgety/restless 0  Suicidal thoughts 0  PHQ-9 Score 6    Relevant past medical, surgical, family and social history reviewed and updated as indicated. Interim medical history since our last visit reviewed. Allergies and medications reviewed and updated.  Review of Systems  Per HPI unless specifically indicated above     Objective:    BP 129/82   Pulse 81   Temp 97.8 F (36.6 C) (Oral)   Wt (!) 345 lb 3.2 oz (156.6 kg)   LMP 01/23/2018 (Exact Date)   SpO2 97%   BMI 47.47 kg/m   Wt Readings from Last 3 Encounters:  02/06/18 (!) 345 lb 3.2 oz (156.6 kg)  11/06/17 (!) 338 lb (153.3 kg)  08/11/15 (!) 350 lb (158.8 kg)    Physical Exam Vitals signs and nursing note reviewed.  Constitutional:      Appearance: Normal appearance. She is obese. She is not ill-appearing.  HENT:     Head: Atraumatic.  Eyes:     Extraocular Movements: Extraocular movements intact.     Conjunctiva/sclera: Conjunctivae normal.  Neck:    Musculoskeletal: Normal range of motion and neck supple.  Cardiovascular:     Rate and Rhythm: Normal rate and regular rhythm.     Heart sounds: Normal heart sounds.  Pulmonary:     Effort: Pulmonary effort is normal.     Breath sounds: Normal breath sounds.  Musculoskeletal: Normal range of motion.  Skin:    General: Skin is warm and dry.  Neurological:     Mental Status: She is alert and oriented to person, place, and time.  Psychiatric:        Mood and Affect: Mood normal.        Thought Content: Thought content normal.        Judgment: Judgment normal.     Results for orders placed or performed during the hospital encounter of 08/11/15  Urine culture  Result Value Ref Range   Specimen Description URINE, CLEAN CATCH    Special Requests Normal    Culture MULTIPLE SPECIES PRESENT, SUGGEST RECOLLECTION (A)    Report Status 08/13/2015 FINAL   Urinalysis complete, with microscopic  Result Value Ref Range   Color, Urine YELLOW YELLOW   APPearance HAZY (A) CLEAR   Glucose, UA NEGATIVE NEGATIVE mg/dL   Bilirubin Urine SMALL (A)  NEGATIVE   Ketones, ur TRACE (A) NEGATIVE mg/dL   Specific Gravity, Urine >1.030 (H) 1.005 - 1.030   Hgb urine dipstick MODERATE (A) NEGATIVE   pH 6.0 5.0 - 8.0   Protein, ur 30 (A) NEGATIVE mg/dL   Nitrite NEGATIVE NEGATIVE   Leukocytes, UA NEGATIVE NEGATIVE   RBC / HPF 0-5 0 - 5 RBC/hpf   WBC, UA 0-5 0 - 5 WBC/hpf   Bacteria, UA MANY (A) NONE SEEN   Squamous Epithelial / LPF 6-30 (A) NONE SEEN  Pregnancy, urine  Result Value Ref Range   Preg Test, Ur NEGATIVE NEGATIVE      Assessment & Plan:   Problem List Items Addressed This Visit      Other   Major depression, recurrent ([place]) - Primary    Still experiencing sedation with zoloft. Start taking at bedtime, and add wellbutrin. Counseling recommended      Relevant Medications   buPROPion (WELLBUTRIN XL) 150 MG 24 hr tablet   Obesity    Referral to dietician placed, lifestyle  modifications reviewed in depth      Relevant Orders   Amb Referral to Nutrition and Diabetic E       Follow up plan: Return in about 4 weeks (around 03/06/2018) for Mood f/u.

## 2018-02-08 NOTE — Assessment & Plan Note (Signed)
Still experiencing sedation with zoloft. Start taking at bedtime, and add wellbutrin. Counseling recommended

## 2018-02-08 NOTE — Assessment & Plan Note (Signed)
Referral to dietician placed, lifestyle modifications reviewed in depth

## 2018-03-13 ENCOUNTER — Ambulatory Visit: Admitting: Family Medicine

## 2018-08-25 ENCOUNTER — Ambulatory Visit: Admitting: Family Medicine

## 2018-08-26 ENCOUNTER — Encounter: Payer: Self-pay | Admitting: Family Medicine

## 2018-08-26 ENCOUNTER — Ambulatory Visit (INDEPENDENT_AMBULATORY_CARE_PROVIDER_SITE_OTHER): Admitting: Family Medicine

## 2018-08-26 ENCOUNTER — Other Ambulatory Visit: Payer: Self-pay

## 2018-08-26 VITALS — BP 108/65 | HR 85 | Temp 97.7°F | Ht 72.0 in | Wt 350.0 lb

## 2018-08-26 DIAGNOSIS — F331 Major depressive disorder, recurrent, moderate: Secondary | ICD-10-CM | POA: Diagnosis not present

## 2018-08-26 DIAGNOSIS — B001 Herpesviral vesicular dermatitis: Secondary | ICD-10-CM | POA: Diagnosis not present

## 2018-08-26 MED ORDER — VALACYCLOVIR HCL 1 G PO TABS: 1000.0000 mg | ORAL_TABLET | Freq: Two times a day (BID) | ORAL | 2 refills | Status: AC | PRN

## 2018-08-26 MED ORDER — SERTRALINE HCL 100 MG PO TABS: 100.0000 mg | ORAL_TABLET | Freq: Every day | ORAL | 1 refills | Status: AC

## 2018-08-26 NOTE — Assessment & Plan Note (Signed)
Will increase zoloft to 100 mg daily and have her f/u with new PCP in [place] once settled to discuss how it's going for her. Discussed cutting tabs in half to get back to previous dose if feeling like the 100 mg dose is too strong for her.

## 2018-08-26 NOTE — Progress Notes (Signed)
BP 108/65 (BP Location: Left Arm, Patient Position: Sitting, Cuff Size: Normal)   Pulse 85   Temp 97.7 F (36.5 C) (Oral)   Ht 6' (1.829 m)   Wt (!) 350 lb (158.8 kg)   SpO2 99%   BMI 47.47 kg/m    Subjective:    Patient ID: Kimberly Burnett, female    DOB: December 08, 1995, 23 y.o.   MRN: [id number]  HPI: Kimberly Burnett is a 23 y.o. female  Chief Complaint  Patient presents with  . Depression    Needs refill on Zoloft  . Anxiety  . Chron's Dosease    Patient had surgery at [facility] 08/10/2018   Presenting today for mood/anxiety f/u. Wellbutrin made her feel "fidgety" and so she stopped that shortly after starting. Still taking the zoloft, doing well as long as taking it consistently. Thnking she may need a higher dose on that - going through a lot of changes right now with pandemic and moving to another state in 2 weeks and buying a house so feels very stressed, overwhelmed and irritable most of the time. Denies side effects to the medicine, SI/HI.   Depression screen [facility] 2/9 08/26/2018 02/06/2018  Decreased Interest 1 1  Down, Depressed, Hopeless 1 1  PHQ - 2 Score 2 2  Altered sleeping 1 1  Tired, decreased energy 1 1  Change in appetite 1 2  Feeling bad or failure about yourself  1 0  Trouble concentrating 0 0  Moving slowly or fidgety/restless 0 0  Suicidal thoughts 0 0  PHQ-9 Score 6 6    Frequent fever blisters lately, thinks it's due to stress related to a recent surgery and moving/buying a house. Has had two flares lately. Uses abreva which seems to help but doesn't take them away quickly. Notes she's dealt with these for most of her life.   Relevant past medical, surgical, family and social history reviewed and updated as indicated. Interim medical history since our last visit reviewed. Allergies and medications reviewed and updated.  Review of Systems  Per HPI unless specifically indicated above     Objective:    BP 108/65 (BP Location: Left Arm, Patient Position:  Sitting, Cuff Size: Normal)   Pulse 85   Temp 97.7 F (36.5 C) (Oral)   Ht 6' (1.829 m)   Wt (!) 350 lb (158.8 kg)   SpO2 99%   BMI 47.47 kg/m   Wt Readings from Last 3 Encounters:  08/26/18 (!) 350 lb (158.8 kg)  02/06/18 (!) 345 lb 3.2 oz (156.6 kg)  11/06/17 (!) 338 lb (153.3 kg)    Physical Exam Vitals signs and nursing note reviewed.  Constitutional:      Appearance: Normal appearance. She is not ill-appearing.  HENT:     Head: Atraumatic.  Eyes:     Extraocular Movements: Extraocular movements intact.     Conjunctiva/sclera: Conjunctivae normal.  Neck:     Musculoskeletal: Normal range of motion and neck supple.  Cardiovascular:     Rate and Rhythm: Normal rate and regular rhythm.     Heart sounds: Normal heart sounds.  Pulmonary:     Effort: Pulmonary effort is normal.     Breath sounds: Normal breath sounds.  Musculoskeletal: Normal range of motion.  Skin:    General: Skin is warm and dry.     Findings: No rash (no active lesion around lips).  Neurological:     Mental Status: She is alert and oriented to person, place,  and time.  Psychiatric:        Mood and Affect: Mood normal.        Thought Content: Thought content normal.        Judgment: Judgment normal.    Results for orders placed or performed during the hospital encounter of 08/11/15  Urine culture   Specimen: Urine, Clean Catch  Result Value Ref Range   Specimen Description URINE, CLEAN CATCH    Special Requests Normal    Culture MULTIPLE SPECIES PRESENT, SUGGEST RECOLLECTION (A)    Report Status 08/13/2015 FINAL   Urinalysis complete, with microscopic  Result Value Ref Range   Color, Urine YELLOW YELLOW   APPearance HAZY (A) CLEAR   Glucose, UA NEGATIVE NEGATIVE mg/dL   Bilirubin Urine SMALL (A) NEGATIVE   Ketones, ur TRACE (A) NEGATIVE mg/dL   Specific Gravity, Urine >1.030 (H) 1.005 - 1.030   Hgb urine dipstick MODERATE (A) NEGATIVE   pH 6.0 5.0 - 8.0   Protein, ur 30 (A) NEGATIVE mg/dL    Nitrite NEGATIVE NEGATIVE   Leukocytes, UA NEGATIVE NEGATIVE   RBC / HPF 0-5 0 - 5 RBC/hpf   WBC, UA 0-5 0 - 5 WBC/hpf   Bacteria, UA MANY (A) NONE SEEN   Squamous Epithelial / LPF 6-30 (A) NONE SEEN  Pregnancy, urine  Result Value Ref Range   Preg Test, Ur NEGATIVE NEGATIVE      Assessment & Plan:   Problem List Items Addressed This Visit      Other   Major depression, recurrent ([place]) - Primary    Will increase zoloft to 100 mg daily and have her f/u with new PCP in [place] once settled to discuss how it's going for her. Discussed cutting tabs in half to get back to previous dose if feeling like the 100 mg dose is too strong for her.       Relevant Medications   sertraline (ZOLOFT) 100 MG tablet    Other Visit Diagnoses    Recurrent cold sores       Will start prn valtrex for flares. May continue abreva prn as well.    Relevant Medications   azaTHIOprine (IMURAN) 50 MG tablet   valACYclovir (VALTREX) 1000 MG tablet       Follow up plan: Return for with new PCP in the next month or so once settled in [place].
